# Patient Record
Sex: Female | Born: 2000 | Race: White | Hispanic: No | Smoking: Never smoker
Health system: Southern US, Community
[De-identification: ages and names within clinical notes are randomized; demographics above are authoritative.]

## PROBLEM LIST (undated history)

## (undated) DIAGNOSIS — J45909 Unspecified asthma, uncomplicated: Secondary | ICD-10-CM

## (undated) DIAGNOSIS — J302 Other seasonal allergic rhinitis: Secondary | ICD-10-CM

## (undated) HISTORY — DX: Unspecified asthma, uncomplicated: J45.909

---
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
## 2018-04-15 IMAGING — DX DG FOOT COMPLETE 3+V*L*
3 series · 3 of 3 positions shown · non-contrast
Comparison: None.

CLINICAL DATA: Blunt trauma to the back of the left heel yesterday.
Pain.

EXAM:
LEFT FOOT - COMPLETE 3+ VIEW

[foot ap]
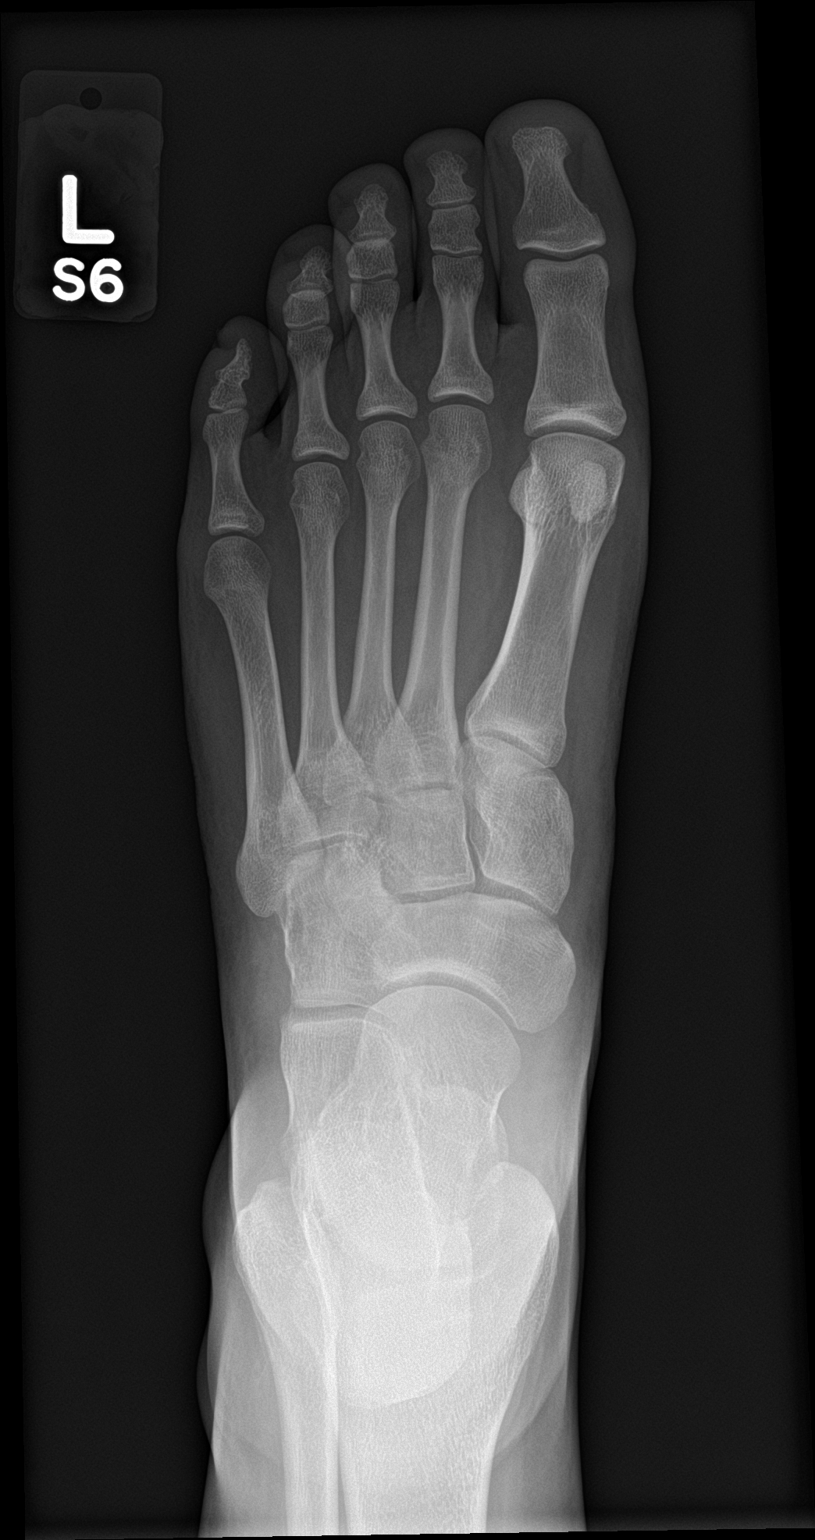

[foot obl]
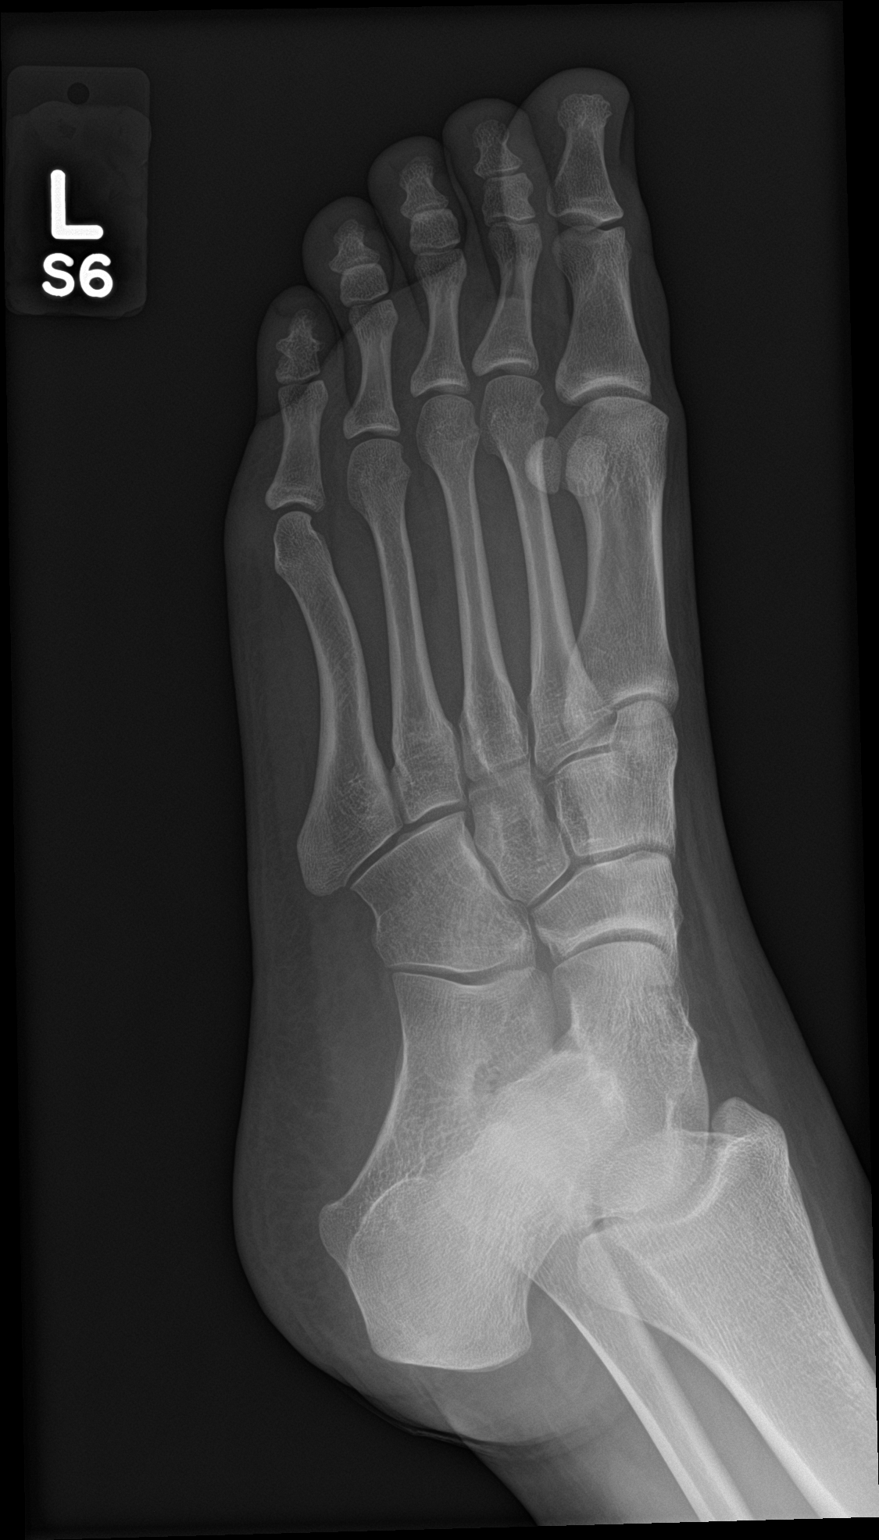

[foot lat]
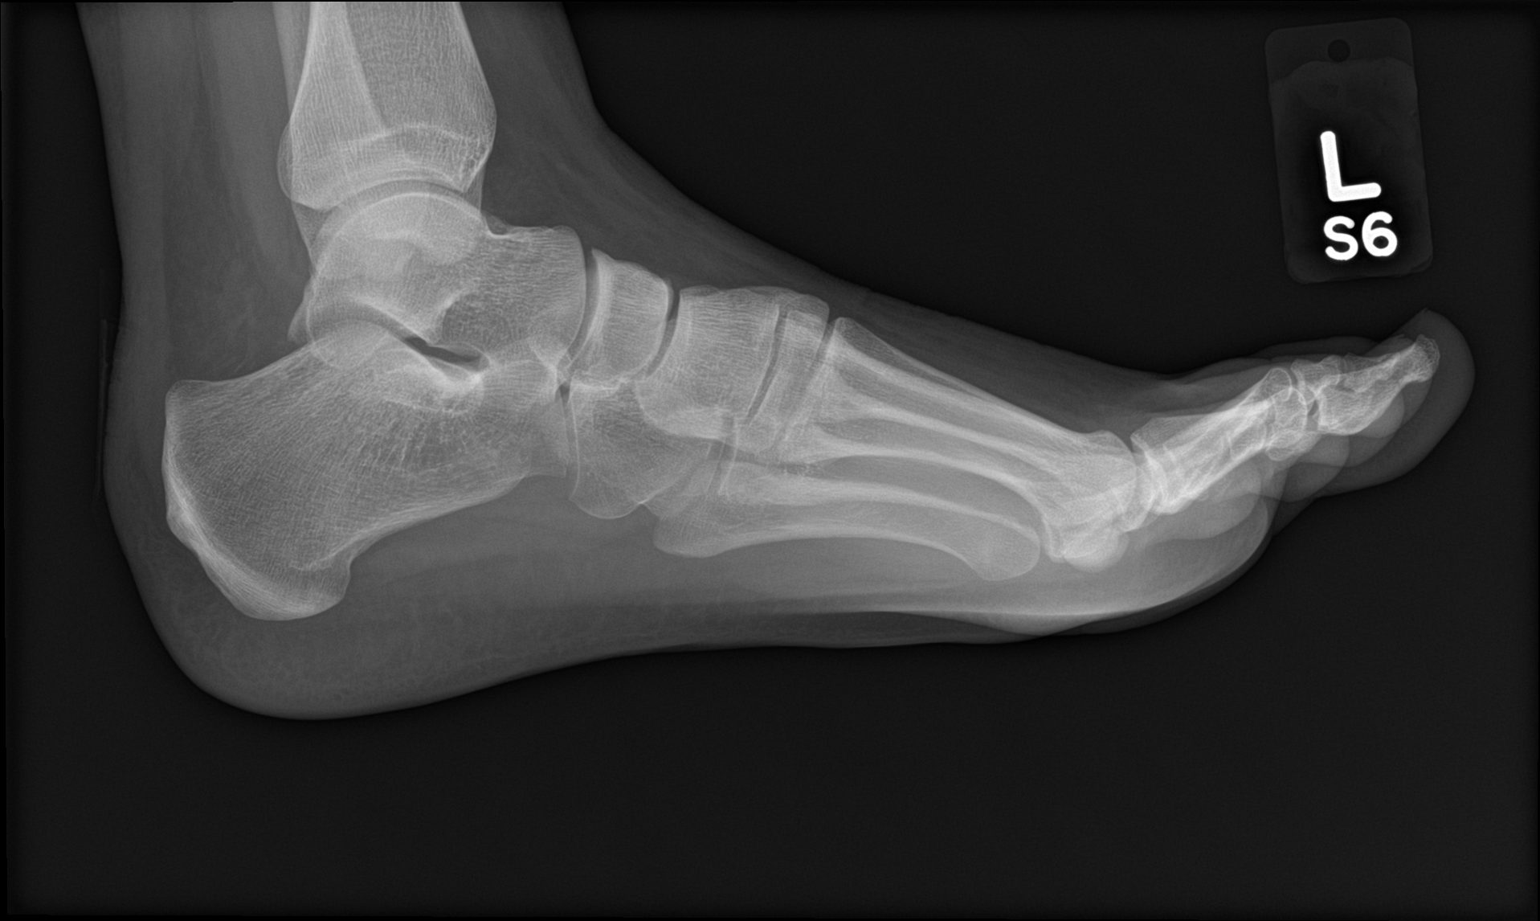

[3 of 3 positions shown; findings below may reference images not displayed]

FINDINGS: There is no evidence of fracture or dislocation. There is no
evidence of arthropathy or other focal bone abnormality. Soft
tissues are unremarkable.
IMPRESSION: Negative.
